# Patient Record
Sex: Male | Born: 1985 | Race: White | Hispanic: No | Marital: Married | Smoking: Current every day smoker
Health system: Southern US, Community
[De-identification: ages and names within clinical notes are randomized; demographics above are authoritative.]

---

## 2018-01-20 ENCOUNTER — Emergency Department (HOSPITAL_COMMUNITY): Admission: EM | Admit: 2018-01-20 | Discharge: 2018-01-20 | Discharge status: Against Medical Advice | Payer: Self-pay

## 2018-01-20 NOTE — ED Notes (Signed)
No answer when called for triage 

## 2018-04-21 ENCOUNTER — Encounter (HOSPITAL_COMMUNITY): Payer: Self-pay

## 2018-04-21 ENCOUNTER — Ambulatory Visit (HOSPITAL_COMMUNITY)
Admission: EM | Admit: 2018-04-21 | Discharge: 2018-04-21 | Disposition: A | Discharge status: Home / Self Care | Payer: Self-pay | Attending: Internal Medicine | Admitting: Internal Medicine

## 2018-04-21 DIAGNOSIS — R1011 Right upper quadrant pain: Secondary | ICD-10-CM | POA: Insufficient documentation

## 2018-04-21 LAB — COMPREHENSIVE METABOLIC PANEL
ALT: 21 U/L (ref 0–44)
AST: 19 U/L (ref 15–41)
Albumin: 4.5 g/dL (ref 3.5–5.0)
Alkaline Phosphatase: 71 U/L (ref 38–126)
Anion gap: 12 (ref 5–15)
BUN: 9 mg/dL (ref 6–20)
CALCIUM: 9.4 mg/dL (ref 8.9–10.3)
CO2: 24 mmol/L (ref 22–32)
Chloride: 105 mmol/L (ref 98–111)
Creatinine, Ser: 0.92 mg/dL (ref 0.61–1.24)
GFR calc non Af Amer: >60 mL/min (ref >60)
GLUCOSE: 80 mg/dL (ref 70–99)
Potassium: 3.8 mmol/L (ref 3.5–5.1)
SODIUM: 141 mmol/L (ref 135–145)
Total Bilirubin: 1.1 mg/dL (ref 0.3–1.2)
Total Protein: 7.3 g/dL (ref 6.5–8.1)

## 2018-04-21 LAB — CBC
HCT: 44 % (ref 39.0–52.0)
Hemoglobin: 15.1 g/dL (ref 13.0–17.0)
MCH: 30 pg (ref 26.0–34.0)
MCHC: 34.3 g/dL (ref 30.0–36.0)
MCV: 87.5 fL (ref 78.0–100.0)
Platelets: 216 10*3/uL (ref 150–400)
RBC: 5.03 MIL/uL (ref 4.22–5.81)
RDW: 11.1 % — ABNORMAL LOW (ref 11.5–15.5)
WBC: 8.3 10*3/uL (ref 4.0–10.5)

## 2018-04-21 LAB — LIPASE, BLOOD: Lipase: 39 U/L (ref 11–51)

## 2018-04-21 MED ORDER — HYDROXYZINE HCL 25 MG PO TABS: 25.0000 mg | ORAL_TABLET | Freq: Four times a day (QID) | ORAL | 0 refills | Status: AC

## 2018-04-21 NOTE — ED Triage Notes (Signed)
Pt presents with complaints of upper abdominal pain, heart burn and lower back pain.

## 2018-04-21 NOTE — Discharge Instructions (Addendum)
Please restart another 2-week trial of either omeprazole 20 mg daily, or Zantac 150 milligrams twice daily  Please also use hydroxyzine every 6 hours as needed for anxiety, this may cause drowsiness, so do not use at work or while driving  Please continue to monitor your symptoms, return if symptoms worsening, developing worsening pain, fever, nausea, vomiting, shortness of breath, chest discomfort  Please try and get [facility], contact information below

## 2018-04-22 ENCOUNTER — Telehealth (HOSPITAL_COMMUNITY): Payer: Self-pay

## 2018-04-22 NOTE — ED Provider Notes (Signed)
[facility]    CSN: [id number] Arrival date & time: 04/21/18  1856     History   Chief Complaint Chief Complaint  Patient presents with  . Abdominal Pain    HPI Brent Molina is a 32 y.o. male no significant past medical history presenting today for evaluation of right upper quadrant pain.  Patient states that over the past 2 months he has had occasional bloating, heartburn and discomfort in his right upper quadrant.  He had a pain earlier today that was more intense than normal and radiated to his right shoulder blade.  He states that it is not truly pain, but more of a discomfort.  He is also concerned if this could be related to anxiety as he feels when he feels more anxious his symptoms worsen.  Also notices worsening after eating certain foods especially fatty foods.  Denies any nausea or vomiting.  Denies any changes in bowel movements, denies diarrhea.  Maintaining oral intake like normal.  Denies fevers.  Patient does note that month ago he was taking Zantac and Prilosec which did improve his symptoms, he did not take them longer for 2 weeks, and afterwards his symptoms worsened.  HPI  History reviewed. No pertinent past medical history.  There are no active problems to display for this patient.   History reviewed. No pertinent surgical history.     Home Medications    Prior to Admission medications   Medication Sig Start Date End Date Taking? Authorizing Provider  hydrOXYzine (ATARAX/VISTARIL) 25 MG tablet Take 1 tablet (25 mg total) by mouth every 6 (six) hours for 14 days. 04/21/18 05/05/18  Laniyah Rosenwald, Junius Creamer, PA-C    Family History History reviewed. No pertinent family history.  Social History Social History   Tobacco Use  . Smoking status: Never Smoker  . Smokeless tobacco: Never Used  Substance Use Topics  . Alcohol use: Not on file  . Drug use: Not on file     Allergies   Patient has no known allergies.   Review of Systems Review of  Systems  Constitutional: Negative for activity change, appetite change, fatigue and fever.  HENT: Negative for congestion.   Eyes: Negative for redness, itching and visual disturbance.  Respiratory: Negative for cough and shortness of breath.   Cardiovascular: Negative for chest pain and leg swelling.  Gastrointestinal: Positive for abdominal pain. Negative for nausea and vomiting.  Genitourinary: Negative for dysuria and frequency.  Musculoskeletal: Negative for arthralgias and myalgias.  Skin: Negative for color change and rash.  Neurological: Negative for dizziness, syncope, weakness, light-headedness and headaches.     Physical Exam Triage Vital Signs ED Triage Vitals [04/21/18 1902]  Enc Vitals Group     BP (!) 149/86     Pulse Rate 99     Resp 18     Temp 98 F (36.7 C)     Temp src      SpO2 100 %     Weight      Height      Head Circumference      Peak Flow      Pain Score 7     Pain Loc      Pain Edu?      Excl. in GC?    No data found.  Updated Vital Signs BP (!) 149/86   Pulse 99   Temp 98 F (36.7 C)   Resp 18   SpO2 100%   Visual Acuity Right Eye Distance:  Left Eye Distance:   Bilateral Distance:    Right Eye Near:   Left Eye Near:    Bilateral Near:     Physical Exam  Constitutional: He appears well-developed and well-nourished.  HENT:  Head: Normocephalic and atraumatic.  Mouth/Throat: Oropharynx is clear and moist.  Oral mucosa pink and moist, no tonsillar enlargement or exudate. Posterior pharynx patent and nonerythematous, no uvula deviation or swelling. Normal phonation.  Eyes: Conjunctivae are normal.  Neck: Neck supple.  Cardiovascular: Normal rate and regular rhythm.  No murmur heard. Pulmonary/Chest: Effort normal and breath sounds normal. No respiratory distress.  Breathing comfortably at rest, CTABL, no wheezing, rales or other adventitious sounds auscultated  Abdominal: Soft. There is no tenderness.  Mild tenderness on  right side and between right upper and lower quadrants, negative Murphy's, negative rebound, no guarding during exam, abdomen soft No CVA tenderness  Musculoskeletal: He exhibits no edema.  Nontender to palpation of entire cervical, thoracic and lumbar spine, nontender to lateral lumbar, thoracic and cervical musculature.  Ambulates easily throughout room  Neurological: He is alert.  Skin: Skin is warm and dry.  Psychiatric: He has a normal mood and affect.  Nursing note and vitals reviewed.    UC Treatments / Results  Labs (all labs ordered are listed, but only abnormal results are displayed) Labs Reviewed  CBC - Abnormal; Notable for the following components:      Result Value   RDW 11.1 (*)    All other components within normal limits  COMPREHENSIVE METABOLIC PANEL  LIPASE, BLOOD    EKG None  Radiology No results found.  Procedures Procedures (including critical care time)  Medications Ordered in UC Medications - No data to display  Initial Impression / Assessment and Plan / UC Course  I have reviewed the triage vital signs and the nursing notes.  Pertinent labs & imaging results that were available during my care of the patient were reviewed by me and considered in my medical decision making (see chart for details).     Pain concerning for possible gallbladder etiology given radiation to the shoulder blade, versus reflux versus related to anxiety.  Discussed starting another trial of reflux, patient without discomfort during time of visit so GI cocktail not provided.  Will start on Prilosec daily.  We will also do trial of hydroxyzine for anxiety.  Discussed getting established with a PCP for further evaluation with outpatient ultrasound of right upper quadrant to evaluate gallbladder.  Lab work obtained in order to check LFTs, lipase and CBC.  Will call patient with any abnormal results.  Return if symptoms worsening, changing, developing new symptoms, fever, nausea,  vomiting.Discussed strict return precautions. Patient verbalized understanding and is agreeable with plan.  Final Clinical Impressions(s) / UC Diagnoses   Final diagnoses:  Right upper quadrant abdominal pain     Discharge Instructions     Please restart another 2-week trial of either omeprazole 20 mg daily, or Zantac 150 milligrams twice daily  Please also use hydroxyzine every 6 hours as needed for anxiety, this may cause drowsiness, so do not use at work or while driving  Please continue to monitor your symptoms, return if symptoms worsening, developing worsening pain, fever, nausea, vomiting, shortness of breath, chest discomfort  Please try and get [facility], contact information below   ED Prescriptions    Medication Sig Dispense Auth. Provider   hydrOXYzine (ATARAX/VISTARIL) 25 MG tablet Take 1 tablet (  25 mg total) by mouth every 6 (six) hours for 14 days. 56 tablet Zaiyah Sottile, [place] C, PA-C     Controlled Substance Prescriptions [place] Controlled Substance Registry consulted? Not Applicable   Lew Dawes, [place] 04/22/18 1001

## 2018-04-26 ENCOUNTER — Ambulatory Visit: Payer: Self-pay | Attending: Family Medicine | Admitting: Physician Assistant

## 2018-04-26 VITALS — BP 108/72 | HR 85 | Temp 98.3°F | Resp 18 | Ht 66.0 in | Wt 188.0 lb

## 2018-04-26 DIAGNOSIS — Z09 Encounter for follow-up examination after completed treatment for conditions other than malignant neoplasm: Secondary | ICD-10-CM

## 2018-04-26 DIAGNOSIS — F419 Anxiety disorder, unspecified: Secondary | ICD-10-CM | POA: Insufficient documentation

## 2018-04-26 DIAGNOSIS — F17200 Nicotine dependence, unspecified, uncomplicated: Secondary | ICD-10-CM

## 2018-04-26 DIAGNOSIS — F17210 Nicotine dependence, cigarettes, uncomplicated: Secondary | ICD-10-CM | POA: Insufficient documentation

## 2018-04-26 MED ORDER — ESCITALOPRAM OXALATE 20 MG PO TABS: 20.0000 mg | ORAL_TABLET | Freq: Every day | ORAL | 5 refills | Status: DC

## 2018-04-26 NOTE — Progress Notes (Signed)
Patient ID: Brent Molina, male   DOB: 22-Apr-1986, 32 y.o.   MRN: [id number]    Brent Molina, is a 32 y.o. male  JOI:[id number]  BRA:[id number]  DOB - [birth year]  Subjective:  Chief Complaint and HPI: Brent Molina is a 32 y.o. male here today After being seen in the ED with abdominal pain and anxiety on 04/21/2108.  CMP/Lipase/CBC WNL.  He is here today mostly for increasing anxiety over the last couple of months and is, at times, having panic attacks.  He has been cutting down on cigs from 1 ppd to 3 cigs/day.  Anxiety runs in the family.  He only has the abdominal pain when he is having a panic attack.  None now.  His sister takes Lexapro and he thinks he would like to try it.  No SI/HI.  Has good family support.    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  Has GF.  smokes 3 cigs per day  From ED note: Pain concerning for possible gallbladder etiology given radiation to the shoulder blade, versus reflux versus related to anxiety.  Discussed starting another trial of reflux, patient without discomfort during time of visit so GI cocktail not provided.  Will start on Prilosec daily.  We will also do trial of hydroxyzine for anxiety.  Discussed getting established with a PCP for further evaluation with outpatient ultrasound of right upper quadrant to evaluate gallbladder.  Lab work obtained in order to check LFTs, lipase and CBC.  Will call patient with any abnormal results.  Return if symptoms worsening, changing, developing new symptoms, fever, nausea, vomiting.Discussed strict return precautions. Patient verbalized understanding and is agreeable with plan.   ROS:   Constitutional:  No f/c, No night sweats, No unexplained weight loss. EENT:  No vision changes, No blurry vision, No hearing changes. No mouth, throat, or ear problems.  Respiratory: No cough, No SOB Cardiac: No CP, no palpitations GI:  No abd pain, No N/V/D. GU: No Urinary  s/sx Musculoskeletal: No joint pain Neuro: No headache, no dizziness, no motor weakness.  Skin: No rash Endocrine:  No polydipsia. No polyuria.  Psych: Denies SI/HI  No problems updated.  ALLERGIES: No Known Allergies  PAST MEDICAL HISTORY: History reviewed. No pertinent past medical history.  MEDICATIONS AT HOME: Prior to Admission medications   Medication Sig Start Date End Date Taking? Authorizing Provider  escitalopram (LEXAPRO) 20 MG tablet Take 1 tablet (20 mg total) by mouth daily. 04/26/18   Anders Simmonds, PA-C  hydrOXYzine (ATARAX/VISTARIL) 25 MG tablet Take 1 tablet (25 mg total) by mouth every 6 (six) hours for 14 days. 04/21/18 05/05/18  Wieters, Hallie C, PA-C     Objective:  EXAM:   Vitals:   04/26/18 1408  BP: 108/72  Pulse: 85  Resp: 18  Temp: 98.3 F (36.8 C)  TempSrc: Oral  SpO2: 98%  Weight: 188 lb (85.3 kg)  Height: 5\' 6"  (1.676 m)    General appearance : A&OX3. NAD. Non-toxic-appearing HEENT: Atraumatic and Normocephalic.  PERRLA. EOM intact.   Neck: supple, no JVD. No cervical lymphadenopathy. No thyromegaly Chest/Lungs:  Breathing-non-labored, Good air entry bilaterally, breath sounds normal without rales, rhonchi, or wheezing  CVS: S1 S2 regular, no murmurs, gallops, rubs  Extremities: Bilateral Lower Ext shows no edema, both legs are warm to touch with = pulse throughout Neurology:  CN II-XII grossly intact, Non focal.   Psych:  TP linear. J/I WNL. Normal speech. Appropriate eye  contact and affect.  Skin:  No Rash  Data Review No results found for: HGBA1C   Assessment & Plan   1. Anxiety - TSH - Vitamin D, 25-hydroxy Titrate up-take 1/4 for first several days, then 1/2 daily for about 1 week then to whole tablet- escitalopram (LEXAPRO) 20 MG tablet; Take 1 tablet (20 mg total) by mouth daily.  Dispense: 30 tablet; Refill: 5.  Counseled about self-care, adequate rest, proper diet, exercise, deep breathing techniques. Spent  face to face counseling.   2. Smoker Continued cessation-can supplement nicotene gum  3. Encounter for examination following treatment at hospital improved     Patient have been counseled extensively about nutrition and exercise  Return in about 1 month (around 05/26/2018) for assign PCP; f/up anxiety.  The patient was given clear instructions to go to ER or return to medical center if symptoms don't improve, worsen or new problems develop. The patient verbalized understanding. The patient was told to call to get lab results if they haven't heard anything in the next week.     Georgian Co, PA-C [facility] [place], [place] [phone]   04/26/2018, 2:25 PM

## 2018-04-26 NOTE — Patient Instructions (Signed)
Stress and Stress Management Stress is a normal reaction to life events. It is what you feel when life demands more than you are used to or more than you can handle. Some stress can be useful. For example, the stress reaction can help you catch the last bus of the day, study for a test, or meet a deadline at work. But stress that occurs too often or for too long can cause problems. It can affect your emotional health and interfere with relationships and normal daily activities. Too much stress can weaken your immune system and increase your risk for physical illness. If you already have a medical problem, stress can make it worse. What are the causes? All sorts of life events may cause stress. An event that causes stress for one person may not be stressful for another person. Major life events commonly cause stress. These may be positive or negative. Examples include losing your job, moving into a new home, getting married, having a baby, or losing a loved one. Less obvious life events may also cause stress, especially if they occur day after day or in combination. Examples include working long hours, driving in traffic, caring for children, being in debt, or being in a difficult relationship. What are the signs or symptoms? Stress may cause emotional symptoms including, the following:  Anxiety. This is feeling worried, afraid, on edge, overwhelmed, or out of control.  Anger. This is feeling irritated or impatient.  Depression. This is feeling sad, down, helpless, or guilty.  Difficulty focusing, remembering, or making decisions.  Stress may cause physical symptoms, including the following:  Aches and pains. These may affect your head, neck, back, stomach, or other areas of your body.  Tight muscles or clenched jaw.  Low energy or trouble sleeping.  Stress may cause unhealthy behaviors, including the following:  Eating to feel better (overeating) or skipping meals.  Sleeping too little,  too much, or both.  Working too much or putting off tasks (procrastination).  Smoking, drinking alcohol, or using drugs to feel better.  How is this diagnosed? Stress is diagnosed through an assessment by your health care provider. Your health care provider will ask questions about your symptoms and any stressful life events.Your health care provider will also ask about your medical history and may order blood tests or other tests. Certain medical conditions and medicine can cause physical symptoms similar to stress. Mental illness can cause emotional symptoms and unhealthy behaviors similar to stress. Your health care provider may refer you to a mental health professional for further evaluation. How is this treated? Stress management is the recommended treatment for stress.The goals of stress management are reducing stressful life events and coping with stress in healthy ways. Techniques for reducing stressful life events include the following:  Stress identification. Self-monitor for stress and identify what causes stress for you. These skills may help you to avoid some stressful events.  Time management. Set your priorities, keep a calendar of events, and learn to say "no." These tools can help you avoid making too many commitments.  Techniques for coping with stress include the following:  Rethinking the problem. Try to think realistically about stressful events rather than ignoring them or overreacting. Try to find the positives in a stressful situation rather than focusing on the negatives.  Exercise. Physical exercise can release both physical and emotional tension. The key is to find a form of exercise you enjoy and do it regularly.  Relaxation techniques. These relax the body and  mind. Examples include yoga, meditation, tai chi, biofeedback, deep breathing, progressive muscle relaxation, listening to music, being out in nature, journaling, and other hobbies. Again, the key is to find  one or more that you enjoy and can do regularly.  Healthy lifestyle. Eat a balanced diet, get plenty of sleep, and do not smoke. Avoid using alcohol or drugs to relax.  Strong support network. Spend time with family, friends, or other people you enjoy being around.Express your feelings and talk things over with someone you trust.  Counseling or talktherapy with a mental health professional may be helpful if you are having difficulty managing stress on your own. Medicine is typically not recommended for the treatment of stress.Talk to your health care provider if you think you need medicine for symptoms of stress. Follow these instructions at home:  Keep all follow-up visits as directed by your health care provider.  Take all medicines as directed by your health care provider. Contact a health care provider if:  Your symptoms get worse or you start having new symptoms.  You feel overwhelmed by your problems and can no longer manage them on your own. Get help right away if:  You feel like hurting yourself or someone else. This information is not intended to replace advice given to you by your health care provider. Make sure you discuss any questions you have with your health care provider. Document Released: 01/26/2001 Document Revised: 01/08/2016 Document Reviewed: 03/27/2013 Elsevier Interactive Patient Education  2017 [employer]vier Inc.  

## 2018-04-27 ENCOUNTER — Telehealth: Payer: Self-pay | Admitting: *Deleted

## 2018-04-27 LAB — VITAMIN D 25 HYDROXY (VIT D DEFICIENCY, FRACTURES): Vit D, 25-Hydroxy: 44.5 ng/mL (ref 30.0–100.0)

## 2018-04-27 LAB — TSH: TSH: 1.05 u[IU]/mL (ref 0.450–4.500)

## 2018-04-27 NOTE — Telephone Encounter (Signed)
-----   Message from Anders SimmondsAngela M McClung, [place]-C sent at 04/27/2018  8:17 AM EDT ----- Your thyroid and vitamin D testing was normal.  Take the medications as we discussed and follow-up as planned. Thanks, Georgian CoAngela McClung, PA-C

## 2018-04-27 NOTE — Telephone Encounter (Signed)
Patient called for lab results, verified DOB, had no questions or concerns.

## 2018-04-27 NOTE — Telephone Encounter (Signed)
Medical Assistant left message on patient's home and cell voicemail. Voicemail states to give [place] call back [facility] [place] [facility] at [phone]. !!!Please inform patient of thyroid and vitamin d being normal. Patient should take medications as discussed and follow up!!!

## 2018-05-26 ENCOUNTER — Ambulatory Visit: Payer: Self-pay | Admitting: Family Medicine

## 2018-06-16 ENCOUNTER — Other Ambulatory Visit: Payer: Self-pay | Admitting: Physician Assistant

## 2018-06-16 DIAGNOSIS — F419 Anxiety disorder, unspecified: Secondary | ICD-10-CM

## 2018-10-16 ENCOUNTER — Other Ambulatory Visit: Payer: Self-pay | Admitting: Physician Assistant

## 2018-10-16 DIAGNOSIS — F419 Anxiety disorder, unspecified: Secondary | ICD-10-CM

## 2018-10-26 ENCOUNTER — Other Ambulatory Visit: Payer: Self-pay | Admitting: Physician Assistant

## 2018-10-26 DIAGNOSIS — F419 Anxiety disorder, unspecified: Secondary | ICD-10-CM

## 2018-11-20 ENCOUNTER — Other Ambulatory Visit: Payer: Self-pay | Admitting: Physician Assistant

## 2018-11-20 DIAGNOSIS — F419 Anxiety disorder, unspecified: Secondary | ICD-10-CM

## 2019-06-17 ENCOUNTER — Emergency Department (HOSPITAL_COMMUNITY)
Admission: EM | Admit: 2019-06-17 | Discharge: 2019-06-17 | Disposition: A | Discharge status: Home / Self Care | Payer: Self-pay | Attending: Emergency Medicine | Admitting: Emergency Medicine

## 2019-06-17 ENCOUNTER — Encounter (HOSPITAL_COMMUNITY): Payer: Self-pay

## 2019-06-17 DIAGNOSIS — Z5321 Procedure and treatment not carried out due to patient leaving prior to being seen by health care provider: Secondary | ICD-10-CM | POA: Insufficient documentation

## 2019-06-17 LAB — CBC
HCT: 48.6 % (ref 39.0–52.0)
Hemoglobin: 16.3 g/dL (ref 13.0–17.0)
MCH: 30.3 pg (ref 26.0–34.0)
MCHC: 33.5 g/dL (ref 30.0–36.0)
MCV: 90.3 fL (ref 80.0–100.0)
Platelets: 240 10*3/uL (ref 150–400)
RBC: 5.38 MIL/uL (ref 4.22–5.81)
RDW: 11.2 % — ABNORMAL LOW (ref 11.5–15.5)
WBC: 8.3 10*3/uL (ref 4.0–10.5)
nRBC: 0 % (ref 0.0–0.2)

## 2019-06-17 LAB — COMPREHENSIVE METABOLIC PANEL
ALT: 25 U/L (ref 0–44)
AST: 19 U/L (ref 15–41)
Albumin: 4.7 g/dL (ref 3.5–5.0)
Alkaline Phosphatase: 72 U/L (ref 38–126)
Anion gap: 9 (ref 5–15)
BUN: 13 mg/dL (ref 6–20)
CO2: 25 mmol/L (ref 22–32)
Calcium: 9.6 mg/dL (ref 8.9–10.3)
Chloride: 104 mmol/L (ref 98–111)
Creatinine, Ser: 0.93 mg/dL (ref 0.61–1.24)
GFR calc Af Amer: >60 mL/min (ref >60)
GFR calc non Af Amer: >60 mL/min (ref >60)
Glucose, Bld: 78 mg/dL (ref 70–99)
Potassium: 4.1 mmol/L (ref 3.5–5.1)
Sodium: 138 mmol/L (ref 135–145)
Total Bilirubin: 1.6 mg/dL — ABNORMAL HIGH (ref 0.3–1.2)
Total Protein: 8.1 g/dL (ref 6.5–8.1)

## 2019-06-17 LAB — LIPASE, BLOOD: Lipase: 23 U/L (ref 11–51)

## 2019-06-17 MED ORDER — SODIUM CHLORIDE 0.9% FLUSH: 3.0000 mL | Freq: Once | INTRAVENOUS | Status: DC

## 2019-06-17 NOTE — ED Triage Notes (Signed)
Pt states RUQ pain x 2 months. Pt states he was seen on Friday and given paxil and bentyl without relief. Pt states that he is very gassy and has been burping, which helps temporarily.

## 2019-06-18 ENCOUNTER — Other Ambulatory Visit: Payer: Self-pay | Admitting: Internal Medicine

## 2019-06-18 ENCOUNTER — Ambulatory Visit (HOSPITAL_COMMUNITY)
Admission: RE | Admit: 2019-06-18 | Discharge: 2019-06-18 | Disposition: A | Discharge status: Home / Self Care | Payer: Self-pay | Source: Ambulatory Visit | Attending: Internal Medicine | Admitting: Internal Medicine

## 2019-06-18 ENCOUNTER — Other Ambulatory Visit: Payer: Self-pay

## 2019-06-18 DIAGNOSIS — R109 Unspecified abdominal pain: Secondary | ICD-10-CM

## 2019-06-18 DIAGNOSIS — R140 Abdominal distension (gaseous): Secondary | ICD-10-CM

## 2021-07-08 ENCOUNTER — Emergency Department (HOSPITAL_COMMUNITY)
Admission: EM | Admit: 2021-07-08 | Discharge: 2021-07-09 | Disposition: A | Discharge status: Home / Self Care | Payer: Self-pay | Attending: Emergency Medicine | Admitting: Emergency Medicine

## 2021-07-08 ENCOUNTER — Other Ambulatory Visit: Payer: Self-pay

## 2021-07-08 DIAGNOSIS — F17200 Nicotine dependence, unspecified, uncomplicated: Secondary | ICD-10-CM | POA: Insufficient documentation

## 2021-07-08 DIAGNOSIS — R110 Nausea: Secondary | ICD-10-CM | POA: Insufficient documentation

## 2021-07-08 DIAGNOSIS — R1011 Right upper quadrant pain: Secondary | ICD-10-CM | POA: Insufficient documentation

## 2021-07-08 LAB — COMPREHENSIVE METABOLIC PANEL
ALT: 40 U/L (ref 0–44)
AST: 24 U/L (ref 15–41)
Albumin: 4.4 g/dL (ref 3.5–5.0)
Alkaline Phosphatase: 82 U/L (ref 38–126)
Anion gap: 8 (ref 5–15)
BUN: 8 mg/dL (ref 6–20)
CO2: 27 mmol/L (ref 22–32)
Calcium: 9.3 mg/dL (ref 8.9–10.3)
Chloride: 104 mmol/L (ref 98–111)
Creatinine, Ser: 0.82 mg/dL (ref 0.61–1.24)
GFR, Estimated: >60 mL/min (ref >60)
Glucose, Bld: 83 mg/dL (ref 70–99)
Potassium: 4 mmol/L (ref 3.5–5.1)
Sodium: 139 mmol/L (ref 135–145)
Total Bilirubin: 0.6 mg/dL (ref 0.3–1.2)
Total Protein: 7.9 g/dL (ref 6.5–8.1)

## 2021-07-08 LAB — CBC
HCT: 45.8 % (ref 39.0–52.0)
Hemoglobin: 15.6 g/dL (ref 13.0–17.0)
MCH: 30.8 pg (ref 26.0–34.0)
MCHC: 34.1 g/dL (ref 30.0–36.0)
MCV: 90.3 fL (ref 80.0–100.0)
Platelets: 246 10*3/uL (ref 150–400)
RBC: 5.07 MIL/uL (ref 4.22–5.81)
RDW: 11.6 % (ref 11.5–15.5)
WBC: 8.5 10*3/uL (ref 4.0–10.5)
nRBC: 0 % (ref 0.0–0.2)

## 2021-07-08 LAB — LIPASE, BLOOD: Lipase: 26 U/L (ref 11–51)

## 2021-07-08 MED ORDER — SUCRALFATE 1 G PO TABS: 1.0000 g | ORAL_TABLET | Freq: Four times a day (QID) | ORAL | 0 refills | Status: AC | PRN

## 2021-07-08 MED ORDER — HYDROXYZINE HCL 25 MG PO TABS
50.0000 mg | ORAL_TABLET | Freq: Once | ORAL | Status: AC
  Administered 2021-07-08: 50 mg via ORAL
  Filled 2021-07-08: qty 2

## 2021-07-08 NOTE — ED Triage Notes (Signed)
Pt presents for "gall bladder attack" last night that has improved this morning. Last night emesis, RUQ pain and back pain, reflux, still has discomfort and reflux. Had a [place] recently that showed abnormal content, not surgical at that time. Concerned that something has changed today.

## 2021-07-08 NOTE — Discharge Instructions (Signed)
You were evaluated in the Emergency Department and after careful evaluation, we did not find any emergent condition requiring admission or further testing in the hospital.  Your exam/testing today was overall reassuring.  Recommend returning for an ultrasound over the next day or 2 for close reevaluation recommend taking the omeprazole at home as well as the Carafate prescription provided.  Recommend follow-up with a GI specialist.  Please return to the Emergency Department if you experience any worsening of your condition.  Thank you for allowing [place] to be a part of your care.

## 2021-07-08 NOTE — ED Provider Notes (Signed)
AP-EMERGENCY DEPT [facility] Emergency Department Provider Note MRN:  [id number]  Arrival date & time: 07/08/21     Chief Complaint   Abdominal Pain   History of Present Illness   Brent Molina is a 35 y.o. year-old male with no pertinent past medical history presenting to the ED with chief complaint of abdominal pain.  Location: Right upper quadrant Duration: A couple years but flared up last night Onset: Sudden Timing: Constant pain Description: Sharp Severity: 9 out of 10, has improved to 4 out of 10 with time Exacerbating/Alleviating Factors: None Associated Symptoms: Nausea Pertinent Negatives: No chest pain or shortness of breath, no headache or vision changes, no vomiting, no black stool or blood in stool  Additional History: This is happened on and off for a few years.  Review of Systems  A complete 10 system review of systems was obtained and all systems are negative except as noted in the HPI and PMH.   Patient's Health History   No past medical history on file.  No past surgical history on file.  No family history on file.  Social History   Socioeconomic History   Marital status: Married    Spouse name: Not on file   Number of children: Not on file   Years of education: Not on file   Highest education level: Not on file  Occupational History   Not on file  Tobacco Use   Smoking status: Every Day   Smokeless tobacco: Never  Substance and Sexual Activity   Alcohol use: Never   Drug use: Never   Sexual activity: Not on file  Other Topics Concern   Not on file  Social History Narrative   Not on file   Social Determinants of Health   Financial Resource Strain: Not on file  Food Insecurity: Not on file  Transportation Needs: Not on file  Physical Activity: Not on file  Stress: Not on file  Social Connections: Not on file  Intimate Partner Violence: Not on file     Physical Exam   Vitals:   07/08/21 2155 07/08/21 2258  BP: (!) 135/109  (!) 136/95  Pulse: 86 77  Resp: 16 17  Temp: 98.2 F (36.8 C)   SpO2: 100% 100%    CONSTITUTIONAL: Well-appearing, NAD NEURO:  Alert and oriented x 3, no focal deficits EYES:  eyes equal and reactive ENT/NECK:  no LAD, no JVD CARDIO: Regular rate, well-perfused, normal S1 and S2 PULM:  CTAB no wheezing or rhonchi GI/GU:  normal bowel sounds, non-distended, non-tender MSK/SPINE:  No gross deformities, no edema SKIN:  no rash, atraumatic PSYCH:  Appropriate speech and behavior  *Additional and/or pertinent findings included in MDM below  Diagnostic and Interventional Summary    EKG Interpretation  Date/Time:  Wednesday July 08 2021 23:50:45 EST Ventricular Rate:  68 PR Interval:  147 QRS Duration: 96 QT Interval:  417 QTC Calculation: 444 R Axis:   65 Text Interpretation: Sinus rhythm Probable left ventricular hypertrophy Confirmed by Kennis Carina [phone]) on 07/08/2021 11:55:48 PM       Labs Reviewed  LIPASE, BLOOD  COMPREHENSIVE METABOLIC PANEL  CBC  URINALYSIS, ROUTINE W REFLEX MICROSCOPIC    US Abdomen Limited RUQ/Gall Gladder    (Results Pending)    Medications  hydrOXYzine (ATARAX/VISTARIL) tablet 50 mg (50 mg Oral Given 07/08/21 2353)     Procedures  /  Critical Care Procedures  ED Course and Medical Decision Making  I have reviewed the triage vital signs,  the nursing notes, and pertinent available records from the EMR.  Listed above are laboratory and imaging tests that I personally ordered, reviewed, and interpreted and then considered in my medical decision making (see below for details).  An acute on chronic abdominal pain presentation.  Otherwise a healthy 35 year old male, has had a history of alcohol use disorder but quit using alcohol 1 month ago.  Vital signs are normal, abdomen is soft and nontender, no rebound guarding or rigidity.  Was evaluated with CT scan at [facility] about 1 month ago and this was reassuring.  See no true indication for  repeat CT imaging at this time, could benefit from repeat ultrasound evaluation to get a closer look at the gallbladder.  Other considerations would be GERD, partial small bowel obstruction that has resolved this patient has passing gas and stool.  Patient agrees to come back for ultrasound and will follow up with GI, appropriate for discharge.       Elmer Sow. Pilar Plate, MD [facility] Emergency Medicine [facility] [email or web address]  Final Clinical Impressions(s) / ED Diagnoses     ICD-10-CM   1. Right upper quadrant abdominal pain  R10.11       ED Discharge Orders          Ordered    sucralfate (CARAFATE) 1 g tablet  4 times daily PRN        07/08/21 2356    US Abdomen Limited RUQ/Gall Gladder        07/08/21 2357             Discharge Instructions Discussed with and Provided to Patient:    Discharge Instructions      You were evaluated in the Emergency Department and after careful evaluation, we did not find any emergent condition requiring admission or further testing in the hospital.  Your exam/testing today was overall reassuring.  Recommend returning for an ultrasound over the next day or 2 for close reevaluation recommend taking the omeprazole at home as well as the Carafate prescription provided.  Recommend follow-up with a GI specialist.  Please return to the Emergency Department if you experience any worsening of your condition.  Thank you for allowing [place] to be a part of your care.        Sabas Sous, MD 07/09/21 0000

## 2021-07-09 LAB — URINALYSIS, ROUTINE W REFLEX MICROSCOPIC
Bilirubin Urine: NEGATIVE
Glucose, UA: NEGATIVE mg/dL
Hgb urine dipstick: NEGATIVE
Ketones, ur: NEGATIVE mg/dL
Leukocytes,Ua: NEGATIVE
Nitrite: NEGATIVE
Protein, ur: NEGATIVE mg/dL
Specific Gravity, Urine: 1.02 (ref 1.005–1.030)
pH: 6 (ref 5.0–8.0)

## 2021-12-21 ENCOUNTER — Ambulatory Visit
Admission: EM | Admit: 2021-12-21 | Discharge: 2021-12-21 | Disposition: A | Discharge status: Home / Self Care | Payer: Self-pay

## 2021-12-21 DIAGNOSIS — T24232A Burn of second degree of left lower leg, initial encounter: Secondary | ICD-10-CM

## 2021-12-21 MED ORDER — DOXYCYCLINE HYCLATE 100 MG PO TABS: 100.0000 mg | ORAL_TABLET | Freq: Two times a day (BID) | ORAL | 0 refills | Status: AC

## 2021-12-21 MED ORDER — SILVER SULFADIAZINE 1 % EX CREA: 1.0000 "application " | TOPICAL_CREAM | Freq: Every day | CUTANEOUS | 0 refills | Status: AC

## 2021-12-21 MED ORDER — IBUPROFEN 800 MG PO TABS: 800.0000 mg | ORAL_TABLET | Freq: Three times a day (TID) | ORAL | 0 refills | Status: AC | PRN
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
## 2021-12-21 NOTE — Discharge Instructions (Signed)
Apply medication as prescribed. ?I have also prescribed an antibiotic, doxycycline to prevent infection.  Take medication as prescribed. ?Keep the area clean and dry. ?I have referred you to wound care for further evaluation based on the surface area of the burn and the degree of the burn. ?Monitor the area for signs of infection to include fever, chills, foul-smelling drainage, increased redness, swelling, or other concerns. ?I would like for you to follow-up in our clinic in 48 hours for a wound check.   ?
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
## 2021-12-25 ENCOUNTER — Emergency Department (HOSPITAL_COMMUNITY)
Admission: EM | Admit: 2021-12-25 | Discharge: 2021-12-25 | Disposition: A | Discharge status: Home / Self Care | Payer: Self-pay | Attending: Emergency Medicine | Admitting: Emergency Medicine

## 2021-12-25 ENCOUNTER — Encounter (HOSPITAL_COMMUNITY): Payer: Self-pay

## 2021-12-25 ENCOUNTER — Other Ambulatory Visit: Payer: Self-pay

## 2021-12-25 ENCOUNTER — Emergency Department (HOSPITAL_COMMUNITY): Payer: Self-pay

## 2021-12-25 DIAGNOSIS — T24231A Burn of second degree of right lower leg, initial encounter: Secondary | ICD-10-CM

## 2021-12-25 DIAGNOSIS — X19XXXA Contact with other heat and hot substances, initial encounter: Secondary | ICD-10-CM | POA: Insufficient documentation

## 2021-12-25 DIAGNOSIS — T24201A Burn of second degree of unspecified site of right lower limb, except ankle and foot, initial encounter: Secondary | ICD-10-CM | POA: Insufficient documentation

## 2021-12-25 LAB — CBC WITH DIFFERENTIAL/PLATELET
Abs Immature Granulocytes: 0.04 10*3/uL (ref 0.00–0.07)
Basophils Absolute: 0 10*3/uL (ref 0.0–0.1)
Basophils Relative: 0 %
Eosinophils Absolute: 0.2 10*3/uL (ref 0.0–0.5)
Eosinophils Relative: 1 %
HCT: 42.5 % (ref 39.0–52.0)
Hemoglobin: 15.1 g/dL (ref 13.0–17.0)
Immature Granulocytes: 0 %
Lymphocytes Relative: 23 %
Lymphs Abs: 2.8 10*3/uL (ref 0.7–4.0)
MCH: 32 pg (ref 26.0–34.0)
MCHC: 35.5 g/dL (ref 30.0–36.0)
MCV: 90 fL (ref 80.0–100.0)
Monocytes Absolute: 1.5 10*3/uL — ABNORMAL HIGH (ref 0.1–1.0)
Monocytes Relative: 12 %
Neutro Abs: 7.9 10*3/uL — ABNORMAL HIGH (ref 1.7–7.7)
Neutrophils Relative %: 64 %
Platelets: 262 10*3/uL (ref 150–400)
RBC: 4.72 MIL/uL (ref 4.22–5.81)
RDW: 11.6 % (ref 11.5–15.5)
WBC: 12.4 10*3/uL — ABNORMAL HIGH (ref 4.0–10.5)
nRBC: 0 % (ref 0.0–0.2)

## 2021-12-25 LAB — BASIC METABOLIC PANEL
Anion gap: 10 (ref 5–15)
BUN: 12 mg/dL (ref 6–20)
CO2: 23 mmol/L (ref 22–32)
Calcium: 9.5 mg/dL (ref 8.9–10.3)
Chloride: 107 mmol/L (ref 98–111)
Creatinine, Ser: 0.88 mg/dL (ref 0.61–1.24)
GFR, Estimated: >60 mL/min (ref >60)
Glucose, Bld: 84 mg/dL (ref 70–99)
Potassium: 3.6 mmol/L (ref 3.5–5.1)
Sodium: 140 mmol/L (ref 135–145)

## 2021-12-25 NOTE — ED Notes (Signed)
PALS  line called ? ? ?
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
## 2021-12-25 NOTE — ED Notes (Signed)
RLE washed with soap and water, silvadene applied, Telfa and Kerlix applied. ?
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
## 2022-04-15 ENCOUNTER — Emergency Department (HOSPITAL_BASED_OUTPATIENT_CLINIC_OR_DEPARTMENT_OTHER): Payer: Self-pay

## 2022-04-15 ENCOUNTER — Emergency Department (HOSPITAL_BASED_OUTPATIENT_CLINIC_OR_DEPARTMENT_OTHER)
Admission: EM | Admit: 2022-04-15 | Discharge: 2022-04-15 | Disposition: A | Discharge status: Home / Self Care | Payer: Self-pay | Attending: Emergency Medicine | Admitting: Emergency Medicine

## 2022-04-15 ENCOUNTER — Encounter (HOSPITAL_BASED_OUTPATIENT_CLINIC_OR_DEPARTMENT_OTHER): Payer: Self-pay

## 2022-04-15 ENCOUNTER — Other Ambulatory Visit: Payer: Self-pay

## 2022-04-15 DIAGNOSIS — R1033 Periumbilical pain: Secondary | ICD-10-CM | POA: Insufficient documentation

## 2022-04-15 DIAGNOSIS — R079 Chest pain, unspecified: Secondary | ICD-10-CM | POA: Insufficient documentation

## 2022-04-15 DIAGNOSIS — R1013 Epigastric pain: Secondary | ICD-10-CM | POA: Insufficient documentation

## 2022-04-15 DIAGNOSIS — R197 Diarrhea, unspecified: Secondary | ICD-10-CM | POA: Insufficient documentation

## 2022-04-15 DIAGNOSIS — R1011 Right upper quadrant pain: Secondary | ICD-10-CM | POA: Insufficient documentation

## 2022-04-15 DIAGNOSIS — R110 Nausea: Secondary | ICD-10-CM | POA: Insufficient documentation

## 2022-04-15 LAB — COMPREHENSIVE METABOLIC PANEL
ALT: 19 U/L (ref 0–44)
AST: 18 U/L (ref 15–41)
Albumin: 4.4 g/dL (ref 3.5–5.0)
Alkaline Phosphatase: 74 U/L (ref 38–126)
Anion gap: 8 (ref 5–15)
BUN: 15 mg/dL (ref 6–20)
CO2: 26 mmol/L (ref 22–32)
Calcium: 9.4 mg/dL (ref 8.9–10.3)
Chloride: 105 mmol/L (ref 98–111)
Creatinine, Ser: 0.95 mg/dL (ref 0.61–1.24)
GFR, Estimated: >60 mL/min (ref >60)
Glucose, Bld: 92 mg/dL (ref 70–99)
Potassium: 4 mmol/L (ref 3.5–5.1)
Sodium: 139 mmol/L (ref 135–145)
Total Bilirubin: 0.4 mg/dL (ref 0.3–1.2)
Total Protein: 7.3 g/dL (ref 6.5–8.1)

## 2022-04-15 LAB — CBC
HCT: 41.7 % (ref 39.0–52.0)
Hemoglobin: 14.7 g/dL (ref 13.0–17.0)
MCH: 30.9 pg (ref 26.0–34.0)
MCHC: 35.3 g/dL (ref 30.0–36.0)
MCV: 87.8 fL (ref 80.0–100.0)
Platelets: 228 10*3/uL (ref 150–400)
RBC: 4.75 MIL/uL (ref 4.22–5.81)
RDW: 11.5 % (ref 11.5–15.5)
WBC: 8.3 10*3/uL (ref 4.0–10.5)
nRBC: 0 % (ref 0.0–0.2)

## 2022-04-15 LAB — URINALYSIS, ROUTINE W REFLEX MICROSCOPIC
Bilirubin Urine: NEGATIVE
Glucose, UA: NEGATIVE mg/dL
Hgb urine dipstick: NEGATIVE
Ketones, ur: NEGATIVE mg/dL
Leukocytes,Ua: NEGATIVE
Nitrite: NEGATIVE
Protein, ur: NEGATIVE mg/dL
Specific Gravity, Urine: 1.018 (ref 1.005–1.030)
pH: 6.5 (ref 5.0–8.0)

## 2022-04-15 LAB — TROPONIN I (HIGH SENSITIVITY): Troponin I (High Sensitivity): <2 ng/L (ref <18)

## 2022-04-15 LAB — LIPASE, BLOOD: Lipase: 17 U/L (ref 11–51)

## 2022-04-15 MED ORDER — DICYCLOMINE HCL 10 MG/5ML PO SOLN
10.0000 mg | Freq: Once | ORAL | Status: DC
  Filled 2022-04-15: qty 5

## 2022-04-15 MED ORDER — DICYCLOMINE HCL 10 MG PO CAPS
10.0000 mg | ORAL_CAPSULE | Freq: Once | ORAL | Status: AC
  Administered 2022-04-15: 10 mg via ORAL
  Filled 2022-04-15: qty 1

## 2022-04-15 MED ORDER — ALUM & MAG HYDROXIDE-SIMETH 200-200-20 MG/5ML PO SUSP
30.0000 mL | Freq: Once | ORAL | Status: AC
  Administered 2022-04-15: 30 mL via ORAL
  Filled 2022-04-15: qty 30

## 2022-04-15 NOTE — ED Triage Notes (Signed)
Patient here POV from Home.  Endorses RUQ ABD Pain that Began Last PM. Associated with Episodes of Diarrhea. History of Gall Bladder Exacerbations in the past Two years.   Some Nausea. No Emesis. No Fevers. Some Heartburn as well.   NAD Noted during Triage. A&Ox4. GCS 15. Ambulatory.

## 2022-04-15 NOTE — Discharge Instructions (Signed)
Your labs and gallbladder ultrasound today were very reassuring.  I recommend avoiding spicy foods and foods high in acid.  Please call your PCP to schedule follow up visit. Please call the GI provider, Dr. Loreta Ave to schedule follow up visit.

## 2022-04-15 NOTE — ED Provider Notes (Signed)
[facility] EMERGENCY DEPT Provider Note   CSN: [id number] Arrival date & time: 04/15/22  1517     History No PMH Chief Complaint  Patient presents with   Abdominal Pain    Brent Molina is a 36 y.o. male. Patient presents to the ED with sudden onset epigastric and right upper quadrant pain that woke him up from sleep at around 4 AM.  This has been intermittent since then.  He says that he notices it radiating up into his chest and between his shoulder blades.  He describes as a sharp pain as well as a heartburn sensation.  He has associated diarrhea 3 times when the pain initially started that was nonbloody. He states that he has had similar symptoms over the past several months that have been on and off, but tonight's episode was the worst.  He states that when he has these episodes that he feels bloated and gets nauseous.  He does note that he has a history of some gallbladder sludge noted on an [place] in the past. He is not been evaluated by general surgery.  He has been evaluated by gastroenterology, but per patient since he does not have insurance they have not been able to do much testing.  Denies any fevers, chills, shortness of breath, vomiting, constipation.  Abdominal Pain Associated symptoms: chest pain, diarrhea and nausea   Associated symptoms: no chills, no constipation, no cough, no dysuria, no fever, no hematuria, no shortness of breath and no vomiting        Home Medications Prior to Admission medications   Medication Sig Start Date End Date Taking? Authorizing Provider  escitalopram (LEXAPRO) 20 MG tablet Take 1 tablet (20 mg total) by mouth daily. MUST MAKE APPT FOR FURTHER REFILLS 10/30/18   Charlott Rakes, MD  ibuprofen (ADVIL) 800 MG tablet Take 1 tablet (800 mg total) by mouth every 8 (eight) hours as needed for moderate pain. 12/21/21   Leath-Warren, Alda Lea, NP  Multiple Vitamin (MULTIVITAMIN) tablet Take 1 tablet by mouth daily.    [provider]  silver sulfADIAZINE (SILVADENE) 1 % cream Apply 1 application. topically daily. 12/21/21   Leath-Warren, Alda Lea, NP  sucralfate (CARAFATE) 1 g tablet Take 1 tablet (1 g total) by mouth 4 (four) times daily as needed. 07/08/21   Maudie Flakes, MD      Allergies    Patient has no known allergies.    Review of Systems   Review of Systems  Constitutional:  Negative for chills and fever.  Respiratory:  Negative for cough and shortness of breath.   Cardiovascular:  Positive for chest pain.  Gastrointestinal:  Positive for abdominal distention, abdominal pain, diarrhea and nausea. Negative for blood in stool, constipation and vomiting.  Genitourinary:  Negative for dysuria, flank pain and hematuria.  Musculoskeletal:  Positive for back pain.  Neurological:  Negative for dizziness, syncope, weakness and numbness.  All other systems reviewed and are negative.   Physical Exam Updated Vital Signs BP (!) 146/93   Pulse 78   Temp 98.2 F (36.8 C) (Oral)   Resp 18   Ht 6\' 2"  (1.88 m)   Wt 90.7 kg   SpO2 98%   BMI 25.67 kg/m  Physical Exam Vitals and nursing note reviewed.  Constitutional:      General: He is not in acute distress.    Appearance: Normal appearance. He is well-developed. He is not ill-appearing, toxic-appearing or diaphoretic.  HENT:     Head:  Normocephalic and atraumatic.     Nose: No nasal deformity.     Mouth/Throat:     Lips: Pink. No lesions.  Eyes:     General: Gaze aligned appropriately. No scleral icterus.       Right eye: No discharge.        Left eye: No discharge.     Conjunctiva/sclera: Conjunctivae normal.     Right eye: Right conjunctiva is not injected. No exudate or hemorrhage.    Left eye: Left conjunctiva is not injected. No exudate or hemorrhage. Cardiovascular:     Rate and Rhythm: Normal rate and regular rhythm.     Heart sounds: Normal heart sounds. No murmur heard.    No friction rub. No gallop.  Pulmonary:     Effort:  Pulmonary effort is normal. No respiratory distress.     Breath sounds: Normal breath sounds. No stridor. No wheezing, rhonchi or rales.  Chest:     Chest wall: No tenderness.  Abdominal:     General: Abdomen is flat. Bowel sounds are normal. There is no distension.     Palpations: Abdomen is soft. There is no mass or pulsatile mass.     Tenderness: There is abdominal tenderness in the right upper quadrant, epigastric area and periumbilical area. There is no right CVA tenderness, left CVA tenderness, guarding or rebound. Negative signs include Murphy's sign and McBurney's sign.     Hernia: No hernia is present.  Skin:    General: Skin is warm and dry.  Neurological:     Mental Status: He is alert and oriented to person, place, and time.  Psychiatric:        Mood and Affect: Mood normal.        Speech: Speech normal.        Behavior: Behavior normal. Behavior is cooperative.     ED Results / Procedures / Treatments   Labs (all labs ordered are listed, but only abnormal results are displayed) Labs Reviewed  LIPASE, BLOOD  COMPREHENSIVE METABOLIC PANEL  CBC  URINALYSIS, ROUTINE W REFLEX MICROSCOPIC  TROPONIN I (HIGH SENSITIVITY)    EKG None  Radiology US Abdomen Limited RUQ (LIVER/GB)  Result Date: 04/15/2022 CLINICAL DATA:  Right upper quadrant pain. EXAM: ULTRASOUND ABDOMEN LIMITED RIGHT UPPER QUADRANT COMPARISON:  Abdominal ultrasound 06/18/2019 FINDINGS: Gallbladder: No gallstones or wall thickening visualized. No sonographic Murphy sign noted by sonographer. Common bile duct: Diameter: 3.3 mm Liver: No focal lesion identified. Within normal limits in parenchymal echogenicity. Portal vein is patent on color Doppler imaging with normal direction of blood flow towards the liver. Other: None. IMPRESSION: Normal right upper quadrant ultrasound. Electronically Signed   By: Darliss Cheney M.D.   On: 04/15/2022 18:24   DG Chest Port 1 View  Result Date: 04/15/2022 CLINICAL DATA:   Chest and epigastric pain EXAM: PORTABLE CHEST 1 VIEW COMPARISON:  None Available. FINDINGS: The heart size and mediastinal contours are within normal limits. Both lungs are clear. The visualized skeletal structures are unremarkable. IMPRESSION: No active disease. Electronically Signed   By: Judie Petit.  Shick M.D.   On: 04/15/2022 16:22    Procedures Procedures  This patient was on telemetry or cardiac monitoring during their time in the ED.    Medications Ordered in ED Medications  alum & mag hydroxide-simeth (MAALOX/MYLANTA) 200-200-20 MG/5ML suspension 30 mL (30 mLs Oral Given 04/15/22 1619)  dicyclomine (BENTYL) capsule 10 mg (10 mg Oral Given 04/15/22 1619)    ED Course/ Medical Decision Making/ A&P  Medical Decision Making Amount and/or Complexity of Data Reviewed Labs: ordered. Radiology: ordered.  Risk OTC drugs. Prescription drug management.    MDM  This is a 36 y.o. male who presents to the ED with upper abdominal pain The differential of this patient includes but is not limited to Aortic Dissection, ACS/MI, Gallbladder etiology (cholecystitis, cholangitis, biliary colic), GERD, Pancreatitis, PUD, and perforation.   Initial Impression  Well appearing and in no acute distress.  Vitals are stable. Afebrile.  I have obtained cardiac workup with these radiating chest and back symptoms, RUQ [place], and will trial GI cocktail. Abdominal labs ordered.   I personally ordered, reviewed, and interpreted all laboratory work and imaging and agree with radiologist interpretation. Results interpreted below:  No leukocytosis, Electrolytes normal, Renal function normal, LFTs normal, Lipase normal, initial troponin < 2, UA normal CXR normal RUQ [place] normal EKG NSR  Assessment/Plan:  Patient has an overall unremarkable work-up.  Specifically there was no signs of any gallbladder etiology.  Cardiac work-up was unremarkable.  He had a normal lipase that is unlikely to be  pancreatitis.  His symptoms have improved after GI cocktail.  I suspect that this is more likely to be a gastritis or PUD.  He also has been under a lot of stress lately and this may be contributing to some of his GI symptoms.  I have low suspicion for perforation or other intra-abdominal pathology at this time given he has had stable vitals and pain is well controlled.  We will defer CT imaging today.  I discussed findings with the patient and have recommended that he follow-up with a gastroenterologist.  I also recommended that he get reestablished with his PCP.  He is stable for discharge.  Return precautions provided.    Charting Requirements Additional history is obtained from:  Independent historian External Records from outside source obtained and reviewed including: Prior Gallbladder US Social Determinants of Health:  none Pertinant PMH that complicates patient's illness: n/a  Patient Care Problems that were addressed during this visit: - Epigastric Abdominal Pain: Acute illness with systemic symptoms This patient was maintained on a cardiac monitor/telemetry. I personally viewed and interpreted the cardiac monitor which reveals an underlying rhythm of NSR Medications given in ED: GI cocktail Reevaluation of the patient after these medicines showed that the patient improved I have reviewed home medications and made changes accordingly.  Critical Care Interventions: n/a Consultations: n/a Disposition: discharge  Portions of this note were generated with Dragon dictation software. Dictation errors may occur despite best attempts at proofreading.    Final Clinical Impression(s) / ED Diagnoses Final diagnoses:  Abdominal pain, epigastric    Rx / DC Orders ED Discharge Orders     None         Claudie Leach, PA-C 04/15/22 2024    Alvira Monday, MD 04/17/22 [phone]
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
## 2023-05-16 ENCOUNTER — Emergency Department (HOSPITAL_BASED_OUTPATIENT_CLINIC_OR_DEPARTMENT_OTHER): Payer: Self-pay | Admitting: Radiology

## 2023-05-16 ENCOUNTER — Other Ambulatory Visit: Payer: Self-pay

## 2023-05-16 ENCOUNTER — Encounter (HOSPITAL_BASED_OUTPATIENT_CLINIC_OR_DEPARTMENT_OTHER): Payer: Self-pay

## 2023-05-16 ENCOUNTER — Emergency Department (HOSPITAL_BASED_OUTPATIENT_CLINIC_OR_DEPARTMENT_OTHER)
Admission: EM | Admit: 2023-05-16 | Discharge: 2023-05-16 | Discharge status: Against Medical Advice | Payer: Self-pay | Attending: Emergency Medicine | Admitting: Emergency Medicine

## 2023-05-16 DIAGNOSIS — M79632 Pain in left forearm: Secondary | ICD-10-CM | POA: Insufficient documentation

## 2023-05-16 DIAGNOSIS — M79602 Pain in left arm: Secondary | ICD-10-CM | POA: Insufficient documentation

## 2023-05-16 DIAGNOSIS — Z5321 Procedure and treatment not carried out due to patient leaving prior to being seen by health care provider: Secondary | ICD-10-CM | POA: Insufficient documentation

## 2023-05-16 LAB — COMPREHENSIVE METABOLIC PANEL
ALT: 26 U/L (ref 0–44)
AST: 29 U/L (ref 15–41)
Albumin: 4.6 g/dL (ref 3.5–5.0)
Alkaline Phosphatase: 82 U/L (ref 38–126)
Anion gap: 11 (ref 5–15)
BUN: 13 mg/dL (ref 6–20)
CO2: 24 mmol/L (ref 22–32)
Calcium: 9.5 mg/dL (ref 8.9–10.3)
Chloride: 100 mmol/L (ref 98–111)
Creatinine, Ser: 0.97 mg/dL (ref 0.61–1.24)
GFR, Estimated: >60 mL/min (ref >60)
Glucose, Bld: 107 mg/dL — ABNORMAL HIGH (ref 70–99)
Potassium: 3.9 mmol/L (ref 3.5–5.1)
Sodium: 135 mmol/L (ref 135–145)
Total Bilirubin: 1.1 mg/dL (ref 0.3–1.2)
Total Protein: 7.9 g/dL (ref 6.5–8.1)

## 2023-05-16 LAB — CBC WITH DIFFERENTIAL/PLATELET
Abs Immature Granulocytes: 0.03 10*3/uL (ref 0.00–0.07)
Basophils Absolute: 0.1 10*3/uL (ref 0.0–0.1)
Basophils Relative: 1 %
Eosinophils Absolute: 0.1 10*3/uL (ref 0.0–0.5)
Eosinophils Relative: 2 %
HCT: 46.4 % (ref 39.0–52.0)
Hemoglobin: 16.3 g/dL (ref 13.0–17.0)
Immature Granulocytes: 0 %
Lymphocytes Relative: 26 %
Lymphs Abs: 2 10*3/uL (ref 0.7–4.0)
MCH: 31.2 pg (ref 26.0–34.0)
MCHC: 35.1 g/dL (ref 30.0–36.0)
MCV: 88.7 fL (ref 80.0–100.0)
Monocytes Absolute: 0.8 10*3/uL (ref 0.1–1.0)
Monocytes Relative: 10 %
Neutro Abs: 4.7 10*3/uL (ref 1.7–7.7)
Neutrophils Relative %: 61 %
Platelets: 262 10*3/uL (ref 150–400)
RBC: 5.23 MIL/uL (ref 4.22–5.81)
RDW: 11.9 % (ref 11.5–15.5)
WBC: 7.6 10*3/uL (ref 4.0–10.5)
nRBC: 0 % (ref 0.0–0.2)
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
